# Patient Record
Sex: Male | Born: 1950 | Race: Black or African American | Hispanic: No | Marital: Single | Smoking: Never smoker
Health system: Southern US, Community
[De-identification: ages and names within clinical notes are randomized; demographics above are authoritative.]

## PROBLEM LIST (undated history)

## (undated) DIAGNOSIS — J45909 Unspecified asthma, uncomplicated: Secondary | ICD-10-CM

## (undated) HISTORY — PX: KNEE SURGERY: SHX244

---

## 2013-11-17 ENCOUNTER — Encounter (HOSPITAL_BASED_OUTPATIENT_CLINIC_OR_DEPARTMENT_OTHER): Payer: Self-pay | Admitting: Emergency Medicine

## 2013-11-17 ENCOUNTER — Emergency Department (HOSPITAL_BASED_OUTPATIENT_CLINIC_OR_DEPARTMENT_OTHER)
Admission: EM | Admit: 2013-11-17 | Discharge: 2013-11-17 | Disposition: A | Discharge status: Home / Self Care | Payer: BC Managed Care – PPO | Attending: Emergency Medicine | Admitting: Emergency Medicine

## 2013-11-17 DIAGNOSIS — J069 Acute upper respiratory infection, unspecified: Secondary | ICD-10-CM

## 2013-11-17 DIAGNOSIS — J45909 Unspecified asthma, uncomplicated: Secondary | ICD-10-CM | POA: Insufficient documentation

## 2013-11-17 HISTORY — DX: Unspecified asthma, uncomplicated: J45.909

## 2013-11-17 MED ORDER — PSEUDOEPHEDRINE HCL 60 MG PO TABS: 60.0000 mg | ORAL_TABLET | Freq: Three times a day (TID) | ORAL | Status: AC | PRN

## 2013-11-17 NOTE — ED Notes (Signed)
Pt reports congestion and runny nose since wed- has been using mucinex without relief

## 2013-11-17 NOTE — Discharge Instructions (Signed)
Cool Mist Vaporizers °Vaporizers may help relieve the symptoms of a cough and cold. They add moisture to the air, which helps mucus to become thinner and less sticky. This makes it easier to breathe and cough up secretions. Cool mist vaporizers do not cause serious burns like hot mist vaporizers ("steamers, humidifiers"). Vaporizers have not been proved to show they help with colds. You should not use a vaporizer if you are allergic to mold.  °HOME CARE INSTRUCTIONS °· Follow the package instructions for the vaporizer. °· Do not use anything other than distilled water in the vaporizer. °· Do not run the vaporizer all of the time. This can cause mold or bacteria to grow in the vaporizer. °· Clean the vaporizer after each time it is used. °· Clean and dry the vaporizer well before storing it. °· Stop using the vaporizer if worsening respiratory symptoms develop. °Document Released: 05/06/2004 Document Revised: 04/11/2013 Document Reviewed: 12/27/2012 °ExitCare® Patient Information ©2014 [employer]. ° °

## 2013-11-17 NOTE — ED Provider Notes (Signed)
CSN: [id number]     Arrival date & time 11/17/13  1810 History   First MD Initiated Contact with Patient 11/17/13 1852     Chief Complaint  Patient presents with  . URI     (Consider location/radiation/quality/duration/timing/severity/associated sxs/prior Treatment) Patient is a 63 y.o. male presenting with URI. The history is provided by the patient.  URI Presenting symptoms: congestion and rhinorrhea   Presenting symptoms: no cough, no ear pain, no facial pain, no fever and no sore throat   Severity:  Mild Onset quality:  Gradual Duration:  3 days Timing:  Constant Progression:  Worsening Chronicity:  New Relieved by:  Nothing Worsened by:  Certain positions Ineffective treatments:  OTC medications Associated symptoms: sneezing   Associated symptoms: no headaches, no myalgias and no sinus pain    Donald Hunt is a 63 y.o. male who presents to the ED with nasal congestion that started 3 days ago and has gotten worse. He has been using mucinex without relief. He just wants something to dry up the runny nose.  PMH significant for Asthma as a child but no symptoms in several years.   Past Medical History  Diagnosis Date  . Asthma    Past Surgical History  Procedure Laterality Date  . Knee surgery     No family history on file. History  Substance Use Topics  . Smoking status: Never Smoker   . Smokeless tobacco: Never Used  . Alcohol Use: No    Review of Systems  Constitutional: Negative for fever.  HENT: Positive for congestion, rhinorrhea and sneezing. Negative for ear pain and sore throat.   Respiratory: Negative for cough.   Musculoskeletal: Negative for myalgias.  Neurological: Negative for headaches.      Allergies  Review of patient's allergies indicates no known allergies.  Home Medications  No current outpatient prescriptions on file. BP 160/93  Pulse 62  Temp(Src) 97.9 F (36.6 C) (Oral)  Resp 12  Ht 5\' 9"  (1.753 m)  Wt 190 lb (86.183 kg)  BMI  28.05 kg/m2  SpO2 98% Physical Exam  Nursing note and vitals reviewed. Constitutional: He is oriented to person, place, and time. He appears well-developed and well-nourished.  HENT:  Head: Normocephalic.  Right Ear: Tympanic membrane normal.  Left Ear: Tympanic membrane normal.  Nose: Rhinorrhea present.  Mouth/Throat: Uvula is midline, oropharynx is clear and moist and mucous membranes are normal.  Eyes: EOM are normal.  Neck: Normal range of motion. Neck supple.  Cardiovascular: Normal rate and regular rhythm.   Pulmonary/Chest: Effort normal. He has no wheezes. He has no rales.  Abdominal: Soft. There is no tenderness.  Musculoskeletal: Normal range of motion.  Lymphadenopathy:    He has no cervical adenopathy.  Neurological: He is alert and oriented to person, place, and time. No cranial nerve deficit.  Skin: Skin is warm and dry.  Psychiatric: He has a normal mood and affect. His behavior is normal.    ED Course  Procedures   MDM  63 y.o. male with URI symptoms. Will treat with decongestant and he will follow up with his doctor or return here for worsening symptoms.  Discussed with the patient and all questioned fully answered.    Medication List         pseudoephedrine 60 MG tablet  Commonly known as:  SUDAFED  Take 1 tablet (60 mg total) by mouth every 8 (eight) hours as needed for congestion.         [place]  Janit Bern, NP 11/18/13 6761

## 2013-11-18 NOTE — ED Provider Notes (Signed)
Medical screening examination/treatment/procedure(s) were performed by non-physician practitioner and as supervising physician I was immediately available for consultation/collaboration.     Veryl Speak, MD 11/18/13 2329

## 2014-04-08 ENCOUNTER — Encounter (HOSPITAL_BASED_OUTPATIENT_CLINIC_OR_DEPARTMENT_OTHER): Payer: Self-pay | Admitting: Emergency Medicine

## 2014-04-08 ENCOUNTER — Emergency Department (HOSPITAL_BASED_OUTPATIENT_CLINIC_OR_DEPARTMENT_OTHER): Payer: BC Managed Care – PPO

## 2014-04-08 ENCOUNTER — Emergency Department (HOSPITAL_BASED_OUTPATIENT_CLINIC_OR_DEPARTMENT_OTHER)
Admission: EM | Admit: 2014-04-08 | Discharge: 2014-04-08 | Disposition: A | Discharge status: Home / Self Care | Payer: BC Managed Care – PPO | Attending: Emergency Medicine | Admitting: Emergency Medicine

## 2014-04-08 DIAGNOSIS — S46909A Unspecified injury of unspecified muscle, fascia and tendon at shoulder and upper arm level, unspecified arm, initial encounter: Secondary | ICD-10-CM | POA: Insufficient documentation

## 2014-04-08 DIAGNOSIS — S199XXA Unspecified injury of neck, initial encounter: Secondary | ICD-10-CM

## 2014-04-08 DIAGNOSIS — S4980XA Other specified injuries of shoulder and upper arm, unspecified arm, initial encounter: Secondary | ICD-10-CM | POA: Insufficient documentation

## 2014-04-08 DIAGNOSIS — S0993XA Unspecified injury of face, initial encounter: Secondary | ICD-10-CM | POA: Diagnosis present

## 2014-04-08 DIAGNOSIS — J45909 Unspecified asthma, uncomplicated: Secondary | ICD-10-CM | POA: Diagnosis not present

## 2014-04-08 DIAGNOSIS — Y9389 Activity, other specified: Secondary | ICD-10-CM | POA: Diagnosis not present

## 2014-04-08 DIAGNOSIS — S139XXA Sprain of joints and ligaments of unspecified parts of neck, initial encounter: Secondary | ICD-10-CM | POA: Insufficient documentation

## 2014-04-08 DIAGNOSIS — Y92410 Unspecified street and highway as the place of occurrence of the external cause: Secondary | ICD-10-CM | POA: Insufficient documentation

## 2014-04-08 DIAGNOSIS — S161XXA Strain of muscle, fascia and tendon at neck level, initial encounter: Secondary | ICD-10-CM

## 2014-04-08 NOTE — ED Provider Notes (Signed)
Medical screening examination/treatment/procedure(s) were performed by non-physician practitioner and as supervising physician I was immediately available for consultation/collaboration.   EKG Interpretation None       Orlie Dakin, MD 04/08/14 [phone]

## 2014-04-08 NOTE — Discharge Instructions (Signed)
Cervical Sprain A cervical sprain is when the tissues (ligaments) that hold the neck bones in place stretch or tear. HOME CARE   Put ice on the injured area.  Put ice in a plastic bag.  Place a towel between your skin and the bag.  Leave the ice on for 15-20 minutes, 3-4 times a day.  You may have been given a collar to wear. This collar keeps your neck from moving while you heal.  Do not take the collar off unless told by your doctor.  If you have long hair, keep it outside of the collar.  Ask your doctor before changing the position of your collar. You may need to change its position over time to make it more comfortable.  If you are allowed to take off the collar for cleaning or bathing, follow your doctor's instructions on how to do it safely.  Keep your collar clean by wiping it with mild soap and water. Dry it completely. If the collar has removable pads, remove them every 1-2 days to hand wash them with soap and water. Allow them to air dry. They should be dry before you wear them in the collar.  Do not drive while wearing the collar.  Only take medicine as told by your doctor.  Keep all doctor visits as told.  Keep all physical therapy visits as told.  Adjust your work station so that you have good posture while you work.  Avoid positions and activities that make your problems worse.  Warm up and stretch before being active. GET HELP IF:  Your pain is not controlled with medicine.  You cannot take less pain medicine over time as planned.  Your activity level does not improve as expected. GET HELP RIGHT AWAY IF:   You are bleeding.  Your stomach is upset.  You have an allergic reaction to your medicine.  You develop new problems that you cannot explain.  You lose feeling (become numb) or you cannot move any part of your body (paralysis).  You have tingling or weakness in any part of your body.  Your symptoms get worse. Symptoms include:  Pain,  soreness, stiffness, puffiness (swelling), or a burning feeling in your neck.  Pain when your neck is touched.  Shoulder or upper back pain.  Limited ability to move your neck.  Headache.  Dizziness.  Your hands or arms feel week, lose feeling, or tingle.  Muscle spasms.  Difficulty swallowing or chewing. MAKE SURE YOU:   Understand these instructions.  Will watch your condition.  Will get help right away if you are not doing well or get worse. Document Released: 01/26/2008 Document Revised: 04/11/2013 Document Reviewed: 02/14/2013 [facility] Patient Information 2015 [place], [place]. This information is not intended to replace advice given to you by your health care provider. Make sure you discuss any questions you have with your health care provider.  Cryotherapy Cryotherapy is when you put ice on your injury. Ice helps lessen pain and puffiness (swelling) after an injury. Ice works the best when you start using it in the first 24 to 48 hours after an injury. HOME CARE  Put a dry or damp towel between the ice pack and your skin.  You may press gently on the ice pack.  Leave the ice on for no more than 10 to 20 minutes at a time.  Check your skin after 5 minutes to make sure your skin is okay.  Rest at least 20 minutes between ice pack uses.  Stop using ice when your skin loses feeling (numbness).  Do not use ice on someone who cannot tell you when it hurts. This includes small children and people with memory problems (dementia). GET HELP RIGHT AWAY IF:  You have white spots on your skin.  Your skin turns blue or pale.  Your skin feels waxy or hard.  Your puffiness gets worse. MAKE SURE YOU:   Understand these instructions.  Will watch your condition.  Will get help right away if you are not doing well or get worse. Document Released: 01/26/2008 Document Revised: 11/01/2011 Document Reviewed: 04/01/2011 [facility] Patient Information 2015 [place], [place]. This  information is not intended to replace advice given to you by your health care provider. Make sure you discuss any questions you have with your health care provider.
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
## 2018-06-11 ENCOUNTER — Encounter (HOSPITAL_BASED_OUTPATIENT_CLINIC_OR_DEPARTMENT_OTHER): Payer: Self-pay | Admitting: *Deleted

## 2018-06-11 ENCOUNTER — Other Ambulatory Visit: Payer: Self-pay

## 2018-06-11 ENCOUNTER — Emergency Department (HOSPITAL_BASED_OUTPATIENT_CLINIC_OR_DEPARTMENT_OTHER): Payer: Self-pay

## 2018-06-11 ENCOUNTER — Emergency Department (HOSPITAL_BASED_OUTPATIENT_CLINIC_OR_DEPARTMENT_OTHER)
Admission: EM | Admit: 2018-06-11 | Discharge: 2018-06-11 | Disposition: A | Discharge status: Home / Self Care | Payer: Self-pay | Attending: Emergency Medicine | Admitting: Emergency Medicine

## 2018-06-11 DIAGNOSIS — J45909 Unspecified asthma, uncomplicated: Secondary | ICD-10-CM | POA: Insufficient documentation

## 2018-06-11 DIAGNOSIS — R2242 Localized swelling, mass and lump, left lower limb: Secondary | ICD-10-CM | POA: Insufficient documentation

## 2018-06-11 DIAGNOSIS — M7652 Patellar tendinitis, left knee: Secondary | ICD-10-CM | POA: Insufficient documentation

## 2018-06-11 MED ORDER — OXYCODONE-ACETAMINOPHEN 5-325 MG PO TABS
1.0000 | ORAL_TABLET | Freq: Once | ORAL | Status: AC
  Administered 2018-06-11: 1 via ORAL
  Filled 2018-06-11: qty 1

## 2018-06-11 NOTE — ED Triage Notes (Signed)
Left knee pain since yesterday. No known injury.  Ambulatory.

## 2018-06-11 NOTE — ED Notes (Signed)
Patient transported to X-ray 

## 2018-06-11 NOTE — ED Notes (Signed)
Pt understood dc material. NAD noted. 

## 2018-06-11 NOTE — ED Provider Notes (Signed)
[facility] EMERGENCY DEPARTMENT Provider Note   CSN: [id number] Arrival date & time: 06/11/18  1710     History   Chief Complaint Chief Complaint  Patient presents with  . Knee Pain    HPI Donald Hunt is a 67 y.o. male here for evaluation of left knee pain.  Onset suddenly, he yesterday.  Pain is all over the anterior knee.  Pain is worse with any palpation, bending, weightbearing.  Applied a heating pad and over-the-counter arthritis medication without relief.  Associated with swelling and mild warmth.  He denies any recent falls or trauma.  No known previous injuries or surgeries.  No recent injections or procedures in the joint.  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  Pain is slightly improved when he is stretching his leg out or resting.  Denies associated fevers, chills, redness to the skin, calf pain or swelling, distal paresthesias or numbness.  No history of gout.  HPI  Past Medical History:  Diagnosis Date  . Asthma     There are no active problems to display for this patient.   Past Surgical History:  Procedure Laterality Date  . KNEE SURGERY          Home Medications    Prior to Admission medications   Medication Sig Start Date End Date Taking? Authorizing Provider  cyclobenzaprine (FLEXERIL) 5 MG tablet Take 5 mg by mouth 3 (three) times daily as needed for muscle spasms.    [provider]  HYDROcodone-acetaminophen (NORCO/VICODIN) 5-325 MG per tablet Take 1 tablet by mouth every 6 (six) hours as needed for moderate pain.    [provider]  pseudoephedrine (SUDAFED) 60 MG tablet Take 1 tablet (60 mg total) by mouth every 8 (eight) hours as needed for congestion. 11/17/13   Ashley Murrain, NP    Family History History reviewed. No pertinent family history.  Social History Social History   Tobacco Use  . Smoking status: Never Smoker  . Smokeless tobacco: Never Used    Substance Use Topics  . Alcohol use: No  . Drug use: No     Allergies   Patient has no known allergies.   Review of Systems Review of Systems  Musculoskeletal: Positive for arthralgias, gait problem and joint swelling.  All other systems reviewed and are negative.    Physical Exam Updated Vital Signs BP (!) 162/87 (BP Location: Right Arm)   Pulse (!) 52   Temp 98.2 F (36.8 C) (Oral)   Resp 16   Ht 5\' 9"  (1.753 m)   Wt 81.6 kg   SpO2 100%   BMI 26.58 kg/m   Physical Exam  Constitutional: He is oriented to person, place, and time. He appears well-developed and well-nourished.  Non-toxic appearance.  HENT:  Head: Normocephalic.  Right Ear: External ear normal.  Left Ear: External ear normal.  Nose: Nose normal.  Eyes: Conjunctivae and EOM are normal.  Neck: Full passive range of motion without pain.  Cardiovascular: Normal rate.  Pulmonary/Chest: Effort normal. No tachypnea. No respiratory distress.  Musculoskeletal: Normal range of motion. He exhibits edema and tenderness.  Left knee: mild medial/infrapatellar edema. Tenderness to medial joint line, patella, patellar tendon and quad tendon.  Full passive ROM of knee with normal J tracking of patella with moderate pain worse with deep flexion.  Positive medial McMurray's test.  Pain with valgus force. No overlaying skin erythema, warmth, abrasions.  No lateral joint line,  MCL, LCL, popliteal space tenderness.  No bony tenderness over fibular head or tibial tuberosity.  Negative Lachman's. Negative posterior drawer test.    Neurological: He is alert and oriented to person, place, and time.  Skin: Skin is warm and dry. Capillary refill takes less than 2 seconds.  Psychiatric: His behavior is normal. Thought content normal.     ED Treatments / Results  Labs (all labs ordered are listed, but only abnormal results are displayed) Labs Reviewed - No data to display  EKG None  Radiology Dg Knee Complete 4 Views  Left  Result Date: 06/11/2018 CLINICAL DATA:  Left knee pain and swelling for 2 days, atraumatic EXAM: LEFT KNEE - COMPLETE 4+ VIEW COMPARISON:  None. FINDINGS: No evidence of fracture, dislocation, or joint effusion. Mild degenerative marginal spurring. Prominent enthesophyte formation along the flexor mechanism. IMPRESSION: 1. No acute finding. 2. Mild degenerative marginal spurring. Electronically Signed   By: Monte Fantasia M.D.   On: 06/11/2018 19:21    Procedures Procedures (including critical care time)  Medications Ordered in ED Medications  oxyCODONE-acetaminophen (PERCOCET/ROXICET) 5-325 MG per tablet 1 tablet (1 tablet Oral Given 06/11/18 1835)     Initial Impression / Assessment and Plan / ED Course  I have reviewed the triage vital signs and the nursing notes.  Pertinent labs & imaging results that were available during my care of the patient were reviewed by me and considered in my medical decision making (see chart for details).      67 year old here for atraumatic left knee pain.  He has focal tenderness to the patellar tendon and medial joint line.  No overlying signs of cellulitis.  Considered gout however he has no history of this and clinical exam is not convincing of this either.  No distal calf edema or tenderness and DVT is unlikely.  He is not on any anticoagulants and has not had any trauma to it so hemarthrosis is unlikely.  X-ray today shows degenerative spurring as well as bony formation in the patellar tendon.  This fits clinical picture.  Extremity is neurovascularly intact.  He still has good strength with knee flexion and extension.  I showed patient his x-rays and explained pain is likely from bony formation in the patellar tendon causing tendinitis.  Will discharge with symptomatic management.  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  Recommended rest, rice, high-dose NSAIDs,  follow-up with orthopedist in 7 to 10 days if the symptoms are not improving.  Return precautions were discussed.  1953: review of x-ray show bony formation in patellar tendon.  Spoke to radiologist (Dr. Pascal Lux) who agrees, will make addendum to impression.  Final Clinical Impressions(s) / ED Diagnoses   Final diagnoses:  Patellar tendinitis of left knee    ED Discharge Orders    None       Arlean Hopping 06/11/18 1955    Hayden Rasmussen, MD 06/11/18 2318

## 2018-06-11 NOTE — Discharge Instructions (Signed)
You were seen in the ER for left knee pain.   X-ray show bony growths/spurring along your patellar tendon which attaches your knee cap to your shin bone.  This is probably causing your pain and inflammation of patellar tendon.   Initial treatment includes rest, ice, ibuprofen and acetaminophen, compression.  Take 600 mg ibuprofen every 6 hours. For more pain control you can add [phone] mg acetaminophen every 6 hours.    If symptoms do not improve, follow up with orthopedist.  Return to ER for increased swelling or pain, redness, warmth, sudden pop sensation in the knee or inability to extend your knee, fevers, chills, calf pain or swelling.

## 2018-06-26 ENCOUNTER — Ambulatory Visit: Payer: Self-pay | Admitting: Family Medicine

## 2018-06-29 ENCOUNTER — Ambulatory Visit: Payer: Self-pay | Admitting: Family Medicine

## 2018-08-14 DIAGNOSIS — H2513 Age-related nuclear cataract, bilateral: Secondary | ICD-10-CM | POA: Diagnosis not present

## 2018-08-14 DIAGNOSIS — H524 Presbyopia: Secondary | ICD-10-CM | POA: Diagnosis not present

## 2018-09-18 DIAGNOSIS — H40013 Open angle with borderline findings, low risk, bilateral: Secondary | ICD-10-CM | POA: Diagnosis not present

## 2018-09-19 DIAGNOSIS — M62838 Other muscle spasm: Secondary | ICD-10-CM | POA: Diagnosis not present

## 2018-09-19 DIAGNOSIS — M792 Neuralgia and neuritis, unspecified: Secondary | ICD-10-CM | POA: Diagnosis not present

## 2018-09-19 DIAGNOSIS — Z79899 Other long term (current) drug therapy: Secondary | ICD-10-CM | POA: Diagnosis not present

## 2018-09-19 DIAGNOSIS — M1990 Unspecified osteoarthritis, unspecified site: Secondary | ICD-10-CM | POA: Diagnosis not present

## 2018-09-19 DIAGNOSIS — M129 Arthropathy, unspecified: Secondary | ICD-10-CM | POA: Diagnosis not present

## 2018-09-28 DIAGNOSIS — M792 Neuralgia and neuritis, unspecified: Secondary | ICD-10-CM | POA: Diagnosis not present

## 2018-09-28 DIAGNOSIS — R7301 Impaired fasting glucose: Secondary | ICD-10-CM | POA: Diagnosis not present

## 2018-09-28 DIAGNOSIS — R5383 Other fatigue: Secondary | ICD-10-CM | POA: Diagnosis not present

## 2018-09-28 DIAGNOSIS — M62838 Other muscle spasm: Secondary | ICD-10-CM | POA: Diagnosis not present

## 2018-09-28 DIAGNOSIS — E7800 Pure hypercholesterolemia, unspecified: Secondary | ICD-10-CM | POA: Diagnosis not present

## 2018-09-28 DIAGNOSIS — M1990 Unspecified osteoarthritis, unspecified site: Secondary | ICD-10-CM | POA: Diagnosis not present

## 2018-09-28 DIAGNOSIS — E559 Vitamin D deficiency, unspecified: Secondary | ICD-10-CM | POA: Diagnosis not present

## 2018-09-28 DIAGNOSIS — K219 Gastro-esophageal reflux disease without esophagitis: Secondary | ICD-10-CM | POA: Diagnosis not present

## 2018-09-28 DIAGNOSIS — Z0000 Encounter for general adult medical examination without abnormal findings: Secondary | ICD-10-CM | POA: Diagnosis not present

## 2018-09-28 DIAGNOSIS — M25561 Pain in right knee: Secondary | ICD-10-CM | POA: Diagnosis not present

## 2018-10-12 DIAGNOSIS — M25562 Pain in left knee: Secondary | ICD-10-CM | POA: Diagnosis not present

## 2018-10-12 DIAGNOSIS — M792 Neuralgia and neuritis, unspecified: Secondary | ICD-10-CM | POA: Diagnosis not present

## 2018-10-12 DIAGNOSIS — Z79899 Other long term (current) drug therapy: Secondary | ICD-10-CM | POA: Diagnosis not present

## 2018-10-12 DIAGNOSIS — M25561 Pain in right knee: Secondary | ICD-10-CM | POA: Diagnosis not present

## 2018-10-12 DIAGNOSIS — G8929 Other chronic pain: Secondary | ICD-10-CM | POA: Diagnosis not present

## 2018-11-09 DIAGNOSIS — E7800 Pure hypercholesterolemia, unspecified: Secondary | ICD-10-CM | POA: Diagnosis not present

## 2018-11-09 DIAGNOSIS — M792 Neuralgia and neuritis, unspecified: Secondary | ICD-10-CM | POA: Diagnosis not present

## 2018-11-09 DIAGNOSIS — Z0000 Encounter for general adult medical examination without abnormal findings: Secondary | ICD-10-CM | POA: Diagnosis not present

## 2018-11-09 DIAGNOSIS — Z79899 Other long term (current) drug therapy: Secondary | ICD-10-CM | POA: Diagnosis not present

## 2018-11-09 DIAGNOSIS — G8929 Other chronic pain: Secondary | ICD-10-CM | POA: Diagnosis not present

## 2018-11-09 DIAGNOSIS — E1165 Type 2 diabetes mellitus with hyperglycemia: Secondary | ICD-10-CM | POA: Diagnosis not present

## 2018-12-05 DIAGNOSIS — M62838 Other muscle spasm: Secondary | ICD-10-CM | POA: Diagnosis not present

## 2018-12-05 DIAGNOSIS — E1165 Type 2 diabetes mellitus with hyperglycemia: Secondary | ICD-10-CM | POA: Diagnosis not present

## 2018-12-05 DIAGNOSIS — M25562 Pain in left knee: Secondary | ICD-10-CM | POA: Diagnosis not present

## 2018-12-05 DIAGNOSIS — Z79899 Other long term (current) drug therapy: Secondary | ICD-10-CM | POA: Diagnosis not present

## 2018-12-05 DIAGNOSIS — M792 Neuralgia and neuritis, unspecified: Secondary | ICD-10-CM | POA: Diagnosis not present

## 2018-12-05 DIAGNOSIS — M25561 Pain in right knee: Secondary | ICD-10-CM | POA: Diagnosis not present

## 2018-12-15 DIAGNOSIS — M109 Gout, unspecified: Secondary | ICD-10-CM | POA: Diagnosis not present

## 2018-12-15 DIAGNOSIS — E7800 Pure hypercholesterolemia, unspecified: Secondary | ICD-10-CM | POA: Diagnosis not present

## 2019-01-11 DIAGNOSIS — G8929 Other chronic pain: Secondary | ICD-10-CM | POA: Diagnosis not present

## 2019-01-11 DIAGNOSIS — M25562 Pain in left knee: Secondary | ICD-10-CM | POA: Diagnosis not present

## 2019-01-11 DIAGNOSIS — M62838 Other muscle spasm: Secondary | ICD-10-CM | POA: Diagnosis not present

## 2019-01-11 DIAGNOSIS — Z79899 Other long term (current) drug therapy: Secondary | ICD-10-CM | POA: Diagnosis not present

## 2019-01-11 DIAGNOSIS — M25561 Pain in right knee: Secondary | ICD-10-CM | POA: Diagnosis not present

## 2019-02-09 DIAGNOSIS — M25561 Pain in right knee: Secondary | ICD-10-CM | POA: Diagnosis not present

## 2019-02-09 DIAGNOSIS — K219 Gastro-esophageal reflux disease without esophagitis: Secondary | ICD-10-CM | POA: Diagnosis not present

## 2019-02-09 DIAGNOSIS — E1165 Type 2 diabetes mellitus with hyperglycemia: Secondary | ICD-10-CM | POA: Diagnosis not present

## 2019-02-09 DIAGNOSIS — G8929 Other chronic pain: Secondary | ICD-10-CM | POA: Diagnosis not present

## 2019-02-09 DIAGNOSIS — M25562 Pain in left knee: Secondary | ICD-10-CM | POA: Diagnosis not present

## 2019-02-09 DIAGNOSIS — Z79899 Other long term (current) drug therapy: Secondary | ICD-10-CM | POA: Diagnosis not present

## 2019-03-09 DIAGNOSIS — Z79899 Other long term (current) drug therapy: Secondary | ICD-10-CM | POA: Diagnosis not present

## 2019-03-09 DIAGNOSIS — M109 Gout, unspecified: Secondary | ICD-10-CM | POA: Diagnosis not present

## 2019-03-09 DIAGNOSIS — E559 Vitamin D deficiency, unspecified: Secondary | ICD-10-CM | POA: Diagnosis not present

## 2019-03-09 DIAGNOSIS — E7800 Pure hypercholesterolemia, unspecified: Secondary | ICD-10-CM | POA: Diagnosis not present

## 2019-03-09 DIAGNOSIS — M62838 Other muscle spasm: Secondary | ICD-10-CM | POA: Diagnosis not present

## 2019-03-09 DIAGNOSIS — E1165 Type 2 diabetes mellitus with hyperglycemia: Secondary | ICD-10-CM | POA: Diagnosis not present

## 2019-03-09 DIAGNOSIS — Z1159 Encounter for screening for other viral diseases: Secondary | ICD-10-CM | POA: Diagnosis not present

## 2019-03-16 DIAGNOSIS — M25632 Stiffness of left wrist, not elsewhere classified: Secondary | ICD-10-CM | POA: Diagnosis not present

## 2019-03-16 DIAGNOSIS — M62532 Muscle wasting and atrophy, not elsewhere classified, left forearm: Secondary | ICD-10-CM | POA: Diagnosis not present

## 2019-03-16 DIAGNOSIS — M79642 Pain in left hand: Secondary | ICD-10-CM | POA: Diagnosis not present

## 2019-03-16 DIAGNOSIS — M25532 Pain in left wrist: Secondary | ICD-10-CM | POA: Diagnosis not present

## 2019-03-16 DIAGNOSIS — M62542 Muscle wasting and atrophy, not elsewhere classified, left hand: Secondary | ICD-10-CM | POA: Diagnosis not present

## 2019-03-20 DIAGNOSIS — M25532 Pain in left wrist: Secondary | ICD-10-CM | POA: Diagnosis not present

## 2019-03-20 DIAGNOSIS — M79642 Pain in left hand: Secondary | ICD-10-CM | POA: Diagnosis not present

## 2019-03-20 DIAGNOSIS — M62542 Muscle wasting and atrophy, not elsewhere classified, left hand: Secondary | ICD-10-CM | POA: Diagnosis not present

## 2019-03-20 DIAGNOSIS — M62532 Muscle wasting and atrophy, not elsewhere classified, left forearm: Secondary | ICD-10-CM | POA: Diagnosis not present

## 2019-03-20 DIAGNOSIS — M25632 Stiffness of left wrist, not elsewhere classified: Secondary | ICD-10-CM | POA: Diagnosis not present

## 2019-03-22 DIAGNOSIS — M25521 Pain in right elbow: Secondary | ICD-10-CM | POA: Diagnosis not present

## 2019-03-22 DIAGNOSIS — M109 Gout, unspecified: Secondary | ICD-10-CM | POA: Diagnosis not present

## 2019-03-27 DIAGNOSIS — M25632 Stiffness of left wrist, not elsewhere classified: Secondary | ICD-10-CM | POA: Diagnosis not present

## 2019-03-27 DIAGNOSIS — M25532 Pain in left wrist: Secondary | ICD-10-CM | POA: Diagnosis not present

## 2019-03-27 DIAGNOSIS — M62542 Muscle wasting and atrophy, not elsewhere classified, left hand: Secondary | ICD-10-CM | POA: Diagnosis not present

## 2019-03-27 DIAGNOSIS — M62532 Muscle wasting and atrophy, not elsewhere classified, left forearm: Secondary | ICD-10-CM | POA: Diagnosis not present

## 2019-03-27 DIAGNOSIS — M79642 Pain in left hand: Secondary | ICD-10-CM | POA: Diagnosis not present

## 2019-03-29 DIAGNOSIS — M62542 Muscle wasting and atrophy, not elsewhere classified, left hand: Secondary | ICD-10-CM | POA: Diagnosis not present

## 2019-03-29 DIAGNOSIS — M25532 Pain in left wrist: Secondary | ICD-10-CM | POA: Diagnosis not present

## 2019-03-29 DIAGNOSIS — M79642 Pain in left hand: Secondary | ICD-10-CM | POA: Diagnosis not present

## 2019-03-29 DIAGNOSIS — M62532 Muscle wasting and atrophy, not elsewhere classified, left forearm: Secondary | ICD-10-CM | POA: Diagnosis not present

## 2019-03-29 DIAGNOSIS — M25632 Stiffness of left wrist, not elsewhere classified: Secondary | ICD-10-CM | POA: Diagnosis not present

## 2019-04-03 DIAGNOSIS — M62542 Muscle wasting and atrophy, not elsewhere classified, left hand: Secondary | ICD-10-CM | POA: Diagnosis not present

## 2019-04-03 DIAGNOSIS — M25532 Pain in left wrist: Secondary | ICD-10-CM | POA: Diagnosis not present

## 2019-04-03 DIAGNOSIS — M79642 Pain in left hand: Secondary | ICD-10-CM | POA: Diagnosis not present

## 2019-04-03 DIAGNOSIS — M62532 Muscle wasting and atrophy, not elsewhere classified, left forearm: Secondary | ICD-10-CM | POA: Diagnosis not present

## 2019-04-03 DIAGNOSIS — M25632 Stiffness of left wrist, not elsewhere classified: Secondary | ICD-10-CM | POA: Diagnosis not present

## 2019-04-06 DIAGNOSIS — Z79899 Other long term (current) drug therapy: Secondary | ICD-10-CM | POA: Diagnosis not present

## 2019-04-06 DIAGNOSIS — Z1159 Encounter for screening for other viral diseases: Secondary | ICD-10-CM | POA: Diagnosis not present

## 2019-04-06 DIAGNOSIS — M79642 Pain in left hand: Secondary | ICD-10-CM | POA: Diagnosis not present

## 2019-04-06 DIAGNOSIS — M25532 Pain in left wrist: Secondary | ICD-10-CM | POA: Diagnosis not present

## 2019-04-06 DIAGNOSIS — M62542 Muscle wasting and atrophy, not elsewhere classified, left hand: Secondary | ICD-10-CM | POA: Diagnosis not present

## 2019-04-06 DIAGNOSIS — E1165 Type 2 diabetes mellitus with hyperglycemia: Secondary | ICD-10-CM | POA: Diagnosis not present

## 2019-04-06 DIAGNOSIS — M109 Gout, unspecified: Secondary | ICD-10-CM | POA: Diagnosis not present

## 2019-04-06 DIAGNOSIS — M25561 Pain in right knee: Secondary | ICD-10-CM | POA: Diagnosis not present

## 2019-04-06 DIAGNOSIS — M25562 Pain in left knee: Secondary | ICD-10-CM | POA: Diagnosis not present

## 2019-04-06 DIAGNOSIS — M62532 Muscle wasting and atrophy, not elsewhere classified, left forearm: Secondary | ICD-10-CM | POA: Diagnosis not present

## 2019-04-06 DIAGNOSIS — G8929 Other chronic pain: Secondary | ICD-10-CM | POA: Diagnosis not present

## 2019-04-06 DIAGNOSIS — M25632 Stiffness of left wrist, not elsewhere classified: Secondary | ICD-10-CM | POA: Diagnosis not present

## 2019-04-09 DIAGNOSIS — M62532 Muscle wasting and atrophy, not elsewhere classified, left forearm: Secondary | ICD-10-CM | POA: Diagnosis not present

## 2019-04-09 DIAGNOSIS — M79642 Pain in left hand: Secondary | ICD-10-CM | POA: Diagnosis not present

## 2019-04-09 DIAGNOSIS — M25532 Pain in left wrist: Secondary | ICD-10-CM | POA: Diagnosis not present

## 2019-04-09 DIAGNOSIS — M25632 Stiffness of left wrist, not elsewhere classified: Secondary | ICD-10-CM | POA: Diagnosis not present

## 2019-04-09 DIAGNOSIS — M62542 Muscle wasting and atrophy, not elsewhere classified, left hand: Secondary | ICD-10-CM | POA: Diagnosis not present

## 2019-04-12 DIAGNOSIS — S5402XS Injury of ulnar nerve at forearm level, left arm, sequela: Secondary | ICD-10-CM | POA: Diagnosis not present

## 2019-04-12 DIAGNOSIS — Z9889 Other specified postprocedural states: Secondary | ICD-10-CM | POA: Diagnosis not present

## 2019-04-12 DIAGNOSIS — M25532 Pain in left wrist: Secondary | ICD-10-CM | POA: Diagnosis not present

## 2019-04-12 DIAGNOSIS — G5622 Lesion of ulnar nerve, left upper limb: Secondary | ICD-10-CM | POA: Diagnosis not present

## 2019-04-18 DIAGNOSIS — Z9189 Other specified personal risk factors, not elsewhere classified: Secondary | ICD-10-CM | POA: Diagnosis not present

## 2019-04-18 DIAGNOSIS — U071 COVID-19: Secondary | ICD-10-CM | POA: Diagnosis not present

## 2019-04-25 ENCOUNTER — Encounter: Payer: Self-pay | Admitting: Neurology

## 2019-04-25 ENCOUNTER — Other Ambulatory Visit: Payer: Self-pay

## 2019-04-25 DIAGNOSIS — S5402XA Injury of ulnar nerve at forearm level, left arm, initial encounter: Secondary | ICD-10-CM | POA: Diagnosis not present

## 2019-04-25 DIAGNOSIS — G5603 Carpal tunnel syndrome, bilateral upper limbs: Secondary | ICD-10-CM

## 2019-04-25 DIAGNOSIS — M25532 Pain in left wrist: Secondary | ICD-10-CM | POA: Diagnosis not present

## 2019-05-02 DIAGNOSIS — K219 Gastro-esophageal reflux disease without esophagitis: Secondary | ICD-10-CM | POA: Diagnosis not present

## 2019-05-02 DIAGNOSIS — Z1211 Encounter for screening for malignant neoplasm of colon: Secondary | ICD-10-CM | POA: Diagnosis not present

## 2019-05-07 DIAGNOSIS — M792 Neuralgia and neuritis, unspecified: Secondary | ICD-10-CM | POA: Diagnosis not present

## 2019-05-07 DIAGNOSIS — M25562 Pain in left knee: Secondary | ICD-10-CM | POA: Diagnosis not present

## 2019-05-07 DIAGNOSIS — E1165 Type 2 diabetes mellitus with hyperglycemia: Secondary | ICD-10-CM | POA: Diagnosis not present

## 2019-05-07 DIAGNOSIS — Z1159 Encounter for screening for other viral diseases: Secondary | ICD-10-CM | POA: Diagnosis not present

## 2019-05-07 DIAGNOSIS — Z79899 Other long term (current) drug therapy: Secondary | ICD-10-CM | POA: Diagnosis not present

## 2019-05-07 DIAGNOSIS — M25561 Pain in right knee: Secondary | ICD-10-CM | POA: Diagnosis not present

## 2019-05-31 ENCOUNTER — Encounter: Payer: Self-pay | Admitting: Neurology

## 2019-06-13 DIAGNOSIS — Z1159 Encounter for screening for other viral diseases: Secondary | ICD-10-CM | POA: Diagnosis not present

## 2019-06-13 DIAGNOSIS — M25562 Pain in left knee: Secondary | ICD-10-CM | POA: Diagnosis not present

## 2019-06-13 DIAGNOSIS — M25561 Pain in right knee: Secondary | ICD-10-CM | POA: Diagnosis not present

## 2019-06-13 DIAGNOSIS — Z23 Encounter for immunization: Secondary | ICD-10-CM | POA: Diagnosis not present

## 2019-06-13 DIAGNOSIS — Z79899 Other long term (current) drug therapy: Secondary | ICD-10-CM | POA: Diagnosis not present

## 2019-06-15 DIAGNOSIS — E119 Type 2 diabetes mellitus without complications: Secondary | ICD-10-CM | POA: Diagnosis not present

## 2019-06-15 DIAGNOSIS — K2100 Gastro-esophageal reflux disease with esophagitis, without bleeding: Secondary | ICD-10-CM | POA: Diagnosis not present

## 2019-06-15 DIAGNOSIS — A048 Other specified bacterial intestinal infections: Secondary | ICD-10-CM | POA: Diagnosis not present

## 2019-06-15 DIAGNOSIS — K2970 Gastritis, unspecified, without bleeding: Secondary | ICD-10-CM | POA: Diagnosis not present

## 2019-06-15 DIAGNOSIS — K5281 Eosinophilic gastritis or gastroenteritis: Secondary | ICD-10-CM | POA: Diagnosis not present

## 2019-06-15 DIAGNOSIS — Z01818 Encounter for other preprocedural examination: Secondary | ICD-10-CM | POA: Diagnosis not present

## 2019-06-15 DIAGNOSIS — K2270 Barrett's esophagus without dysplasia: Secondary | ICD-10-CM | POA: Diagnosis not present

## 2019-06-15 DIAGNOSIS — K219 Gastro-esophageal reflux disease without esophagitis: Secondary | ICD-10-CM | POA: Diagnosis not present

## 2019-06-29 DIAGNOSIS — B9681 Helicobacter pylori [H. pylori] as the cause of diseases classified elsewhere: Secondary | ICD-10-CM | POA: Diagnosis not present

## 2019-06-29 DIAGNOSIS — K2950 Unspecified chronic gastritis without bleeding: Secondary | ICD-10-CM | POA: Diagnosis not present

## 2019-06-29 DIAGNOSIS — K2970 Gastritis, unspecified, without bleeding: Secondary | ICD-10-CM | POA: Diagnosis not present

## 2019-07-05 ENCOUNTER — Ambulatory Visit (INDEPENDENT_AMBULATORY_CARE_PROVIDER_SITE_OTHER): Payer: Medicare Other | Admitting: Neurology

## 2019-07-05 ENCOUNTER — Other Ambulatory Visit: Payer: Self-pay

## 2019-07-05 DIAGNOSIS — G5603 Carpal tunnel syndrome, bilateral upper limbs: Secondary | ICD-10-CM | POA: Diagnosis not present

## 2019-07-05 DIAGNOSIS — G5623 Lesion of ulnar nerve, bilateral upper limbs: Secondary | ICD-10-CM

## 2019-07-05 NOTE — Procedures (Signed)
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  NCV & EMG Findings: Extensive electrodiagnostic testing of the left upper extremity and additional studies of the right shows:  1. Bilateral median and left ulnar sensory responses show prolonged latency (L3.9, R4.5, L3.6 ms).  Bilateral dorsal ulnar cutaneous and right ulnar sensory responses are within normal limits. 2. Bilateral median motor responses are within normal limits.  Bilateral ulnar motor responses show slowed conduction velocity across the elbow (A Elbow-B Elbow, L43, R42 m/s) without conduction block.   3. In the left upper extremity, chronic motor axonal loss changes are seen affecting the first dorsal interosseous, abductor digiti minimi, and flexor carpi ulnaris muscles, without accompanied active denervation.  These findings are not present in the right upper extremity.  Impression: 1. Left ulnar neuropathy with slowing across the elbow, purely demyelinating, moderate. 2. Right ulnar neuropathy with slowing across the elbow, purely demyelinating, mild. 3. Bilateral median neuropathy at or distal to the wrist (mild), consistent with a clinical diagnosis of carpal tunnel syndrome.     ___________________________ Narda Amber, DO    Nerve Conduction Studies Anti Sensory Summary Table   Site NR Peak (ms) Norm Peak (ms) P-T Amp (V) Norm P-T Amp  Left DorsCutan Anti Sensory (Dorsum 5th MC)  35C  Wrist    2.0 <3.2 9.2 >5  Right DorsCutan Anti Sensory (Dorsum  5th MC)  35C  Wrist    2.2 <3.2 8.4 >5  Left Median Anti Sensory (2nd Digit)  35C  Wrist    3.9 <3.8 11.4 >10  Right Median Anti Sensory (2nd Digit)  35C  Wrist    4.5 <3.8 10.1 >10  Left Ulnar Anti Sensory (5th Digit)  35C  Wrist    3.6 <3.2 7.1 >5  Right Ulnar Anti Sensory (5th Digit)  35C  Wrist    3.2 <3.2 9.2 >5   Motor Summary Table   Site NR Onset (ms) Norm Onset (ms) O-P Amp (mV) Norm O-P Amp Site1 Site2 Delta-0 (ms) Dist (cm) Vel (m/s) Norm Vel (m/s)  Left Median Motor (Abd Poll Brev)  35C  Wrist    3.9 <4.0 11.9 >5 Elbow Wrist 6.1 32.0 52 >50  Elbow    10.0  10.8         Right Median Motor (Abd Poll Brev)  35C  Wrist    3.9 <4.0 13.5 >5 Elbow Wrist 5.9 30.0 51 >50  Elbow    9.8  12.4         Left Ulnar Motor (Abd Dig Minimi)  35C  Wrist    2.7 <3.1 11.6 >7 B Elbow Wrist 4.6 25.0 54 >50  B Elbow    7.3  10.1  A Elbow B Elbow 2.3 10.0 43 >50  A Elbow    9.6  9.4         Right Ulnar Motor (Abd Dig Minimi)  35C  Wrist    2.6 <3.1 11.4 >7 B Elbow Wrist 4.6 24.0 52 >50  B Elbow    7.2  10.2  A Elbow B Elbow 2.4 10.0 42 >50  A Elbow    9.6  9.9          EMG   Side Muscle Ins Act Fibs Psw Fasc Number Recrt Dur Dur. Amp Amp. Poly Poly. Comment  Left FlexCarpiUln Nml Nml Nml Nml 1- Rapid Few 1+ Few 1+ Nml Nml N/A  Left 1stDorInt Nml Nml Nml Nml 2- Rapid Many 1+ Many 1+ Many 1+ N/A  Left ABD Dig Min Nml Nml Nml Nml 2- Rapid Most 1+ Most 1+ Most 1+ N/A  Left Abd Poll Brev Nml Nml Nml Nml Nml Nml Nml Nml Nml Nml Nml Nml N/A  Left Ext Indicis Nml Nml Nml Nml Nml Nml Nml Nml Nml Nml Nml Nml N/A  Left PronatorTeres Nml Nml Nml Nml Nml Nml Nml Nml Nml Nml Nml Nml N/A  Left Triceps Nml Nml Nml Nml Nml Nml Nml Nml Nml Nml Nml Nml N/A  Left Deltoid Nml Nml Nml Nml Nml Nml Nml Nml Nml Nml Nml Nml N/A  Left Biceps Nml Nml Nml Nml Nml Nml Nml Nml Nml Nml Nml Nml N/A  Right 1stDorInt Nml Nml Nml Nml Nml Nml Nml Nml Nml Nml Nml Nml N/A  Right Abd Poll Brev Nml Nml Nml Nml Nml Nml  Nml Nml Nml Nml Nml Nml N/A  Right PronatorTeres Nml Nml Nml Nml Nml Nml Nml Nml Nml Nml Nml Nml N/A  Right Biceps Nml Nml Nml Nml Nml Nml Nml Nml Nml Nml Nml Nml N/A  Right Triceps Nml Nml Nml Nml Nml Nml Nml Nml Nml Nml Nml Nml N/A  Right Deltoid Nml Nml Nml Nml Nml Nml Nml Nml Nml Nml Nml Nml N/A  Right ABD Dig Min Nml Nml Nml Nml Nml Nml Nml Nml Nml Nml Nml Nml N/A  Right FlexCarpiUln Nml Nml Nml Nml Nml Nml Nml Nml Nml Nml Nml Nml N/A      Waveforms:

## 2019-07-06 DIAGNOSIS — E7800 Pure hypercholesterolemia, unspecified: Secondary | ICD-10-CM | POA: Diagnosis not present

## 2019-07-06 DIAGNOSIS — M109 Gout, unspecified: Secondary | ICD-10-CM | POA: Diagnosis not present

## 2019-07-06 DIAGNOSIS — M25562 Pain in left knee: Secondary | ICD-10-CM | POA: Diagnosis not present

## 2019-07-06 DIAGNOSIS — M25561 Pain in right knee: Secondary | ICD-10-CM | POA: Diagnosis not present

## 2019-07-06 DIAGNOSIS — E559 Vitamin D deficiency, unspecified: Secondary | ICD-10-CM | POA: Diagnosis not present

## 2019-07-06 DIAGNOSIS — Z79899 Other long term (current) drug therapy: Secondary | ICD-10-CM | POA: Diagnosis not present

## 2019-07-06 DIAGNOSIS — G8929 Other chronic pain: Secondary | ICD-10-CM | POA: Diagnosis not present

## 2019-07-06 DIAGNOSIS — E1165 Type 2 diabetes mellitus with hyperglycemia: Secondary | ICD-10-CM | POA: Diagnosis not present

## 2019-07-06 DIAGNOSIS — Z1159 Encounter for screening for other viral diseases: Secondary | ICD-10-CM | POA: Diagnosis not present

## 2019-07-13 DIAGNOSIS — Z1211 Encounter for screening for malignant neoplasm of colon: Secondary | ICD-10-CM | POA: Diagnosis not present

## 2019-07-13 DIAGNOSIS — A048 Other specified bacterial intestinal infections: Secondary | ICD-10-CM | POA: Diagnosis not present

## 2019-07-13 DIAGNOSIS — Z01818 Encounter for other preprocedural examination: Secondary | ICD-10-CM | POA: Diagnosis not present

## 2019-07-13 DIAGNOSIS — K635 Polyp of colon: Secondary | ICD-10-CM | POA: Diagnosis not present

## 2019-07-13 DIAGNOSIS — E1165 Type 2 diabetes mellitus with hyperglycemia: Secondary | ICD-10-CM | POA: Diagnosis not present

## 2019-07-24 DIAGNOSIS — G5622 Lesion of ulnar nerve, left upper limb: Secondary | ICD-10-CM | POA: Diagnosis not present

## 2019-07-31 DIAGNOSIS — M792 Neuralgia and neuritis, unspecified: Secondary | ICD-10-CM | POA: Diagnosis not present

## 2019-07-31 DIAGNOSIS — M25562 Pain in left knee: Secondary | ICD-10-CM | POA: Diagnosis not present

## 2019-07-31 DIAGNOSIS — E1165 Type 2 diabetes mellitus with hyperglycemia: Secondary | ICD-10-CM | POA: Diagnosis not present

## 2019-07-31 DIAGNOSIS — Z79899 Other long term (current) drug therapy: Secondary | ICD-10-CM | POA: Diagnosis not present

## 2019-07-31 DIAGNOSIS — M25561 Pain in right knee: Secondary | ICD-10-CM | POA: Diagnosis not present

## 2019-08-03 DIAGNOSIS — D128 Benign neoplasm of rectum: Secondary | ICD-10-CM | POA: Diagnosis not present

## 2019-08-03 DIAGNOSIS — E1165 Type 2 diabetes mellitus with hyperglycemia: Secondary | ICD-10-CM | POA: Diagnosis not present

## 2019-08-03 DIAGNOSIS — K648 Other hemorrhoids: Secondary | ICD-10-CM | POA: Diagnosis not present

## 2019-08-14 DIAGNOSIS — R2242 Localized swelling, mass and lump, left lower limb: Secondary | ICD-10-CM | POA: Diagnosis not present

## 2019-08-29 DIAGNOSIS — Z1159 Encounter for screening for other viral diseases: Secondary | ICD-10-CM | POA: Diagnosis not present

## 2019-08-29 DIAGNOSIS — Z79891 Long term (current) use of opiate analgesic: Secondary | ICD-10-CM | POA: Diagnosis not present

## 2019-08-29 DIAGNOSIS — E1165 Type 2 diabetes mellitus with hyperglycemia: Secondary | ICD-10-CM | POA: Diagnosis not present

## 2019-08-29 DIAGNOSIS — G8929 Other chronic pain: Secondary | ICD-10-CM | POA: Diagnosis not present

## 2019-08-29 DIAGNOSIS — Z79899 Other long term (current) drug therapy: Secondary | ICD-10-CM | POA: Diagnosis not present

## 2019-08-29 DIAGNOSIS — M25561 Pain in right knee: Secondary | ICD-10-CM | POA: Diagnosis not present

## 2019-08-29 DIAGNOSIS — M25562 Pain in left knee: Secondary | ICD-10-CM | POA: Diagnosis not present

## 2019-10-30 IMAGING — CR DG KNEE COMPLETE 4+V*L*
4 series · 4 of 4 positions shown · non-contrast
Comparison: None.

ADDENDUM:
Please note enthesophyte formation is along the extensor
mechanism-particularly the patella and tibial tuberosity.
CLINICAL DATA: Left knee pain and swelling for 2 days, atraumatic

EXAM:
LEFT KNEE - COMPLETE 4+ VIEW

[t knee ap left]
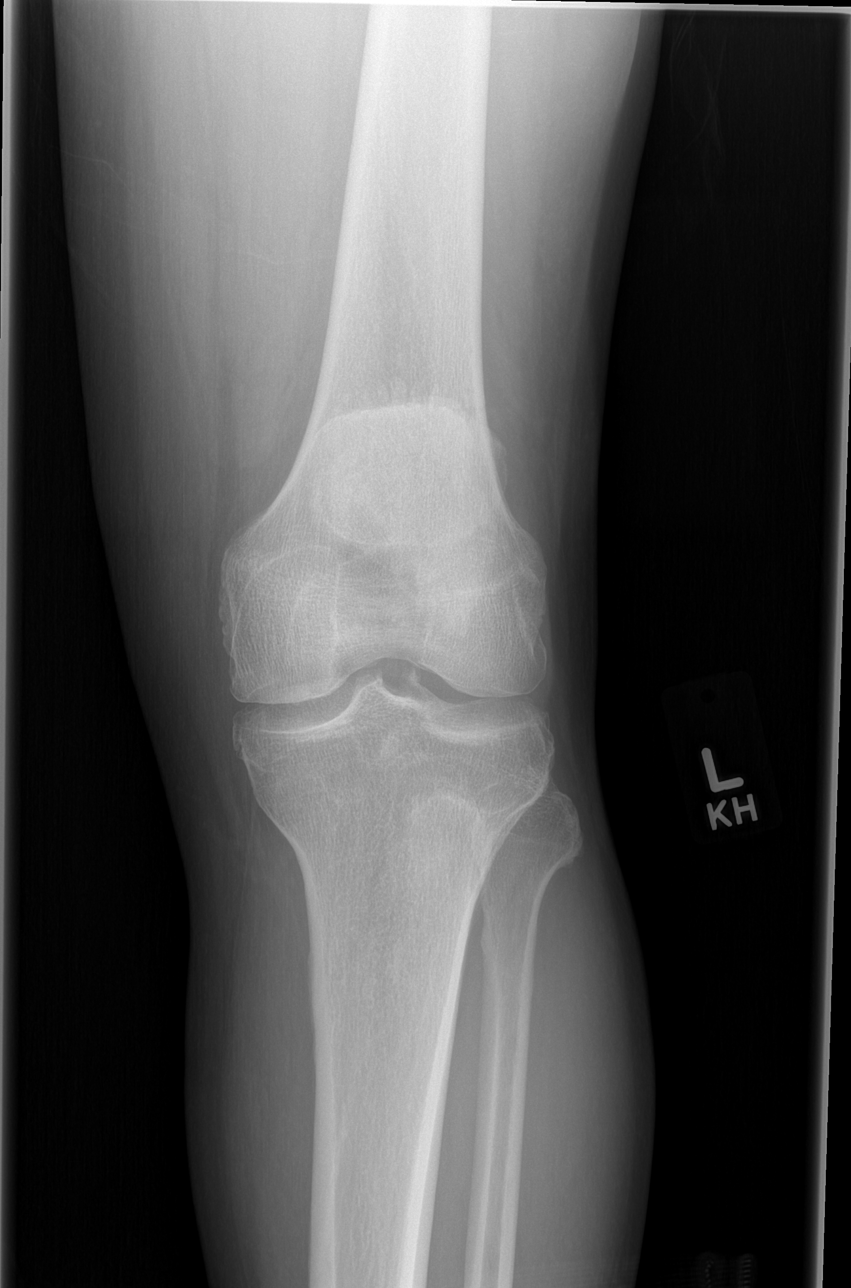

[t knee oblique left (1 of 2)]
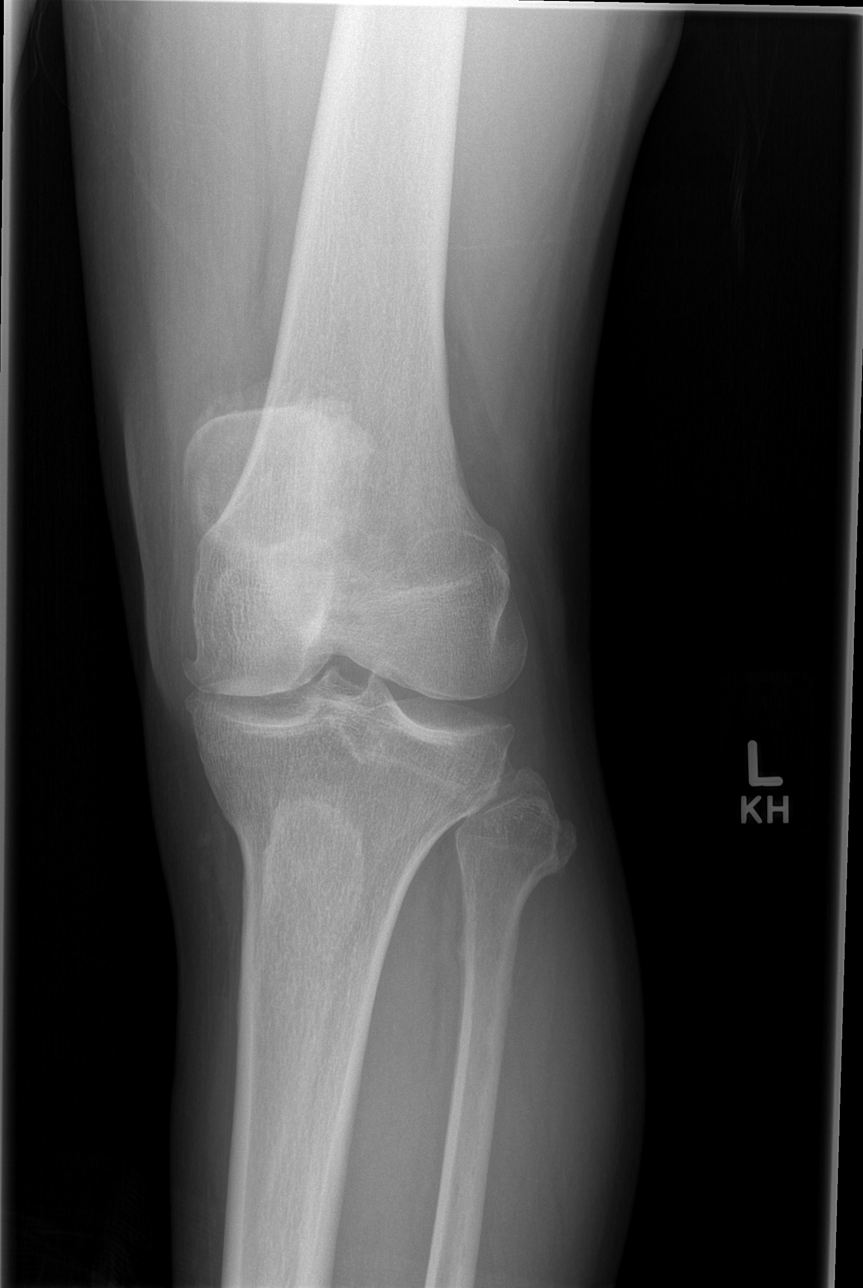

[t knee oblique left (2 of 2)]
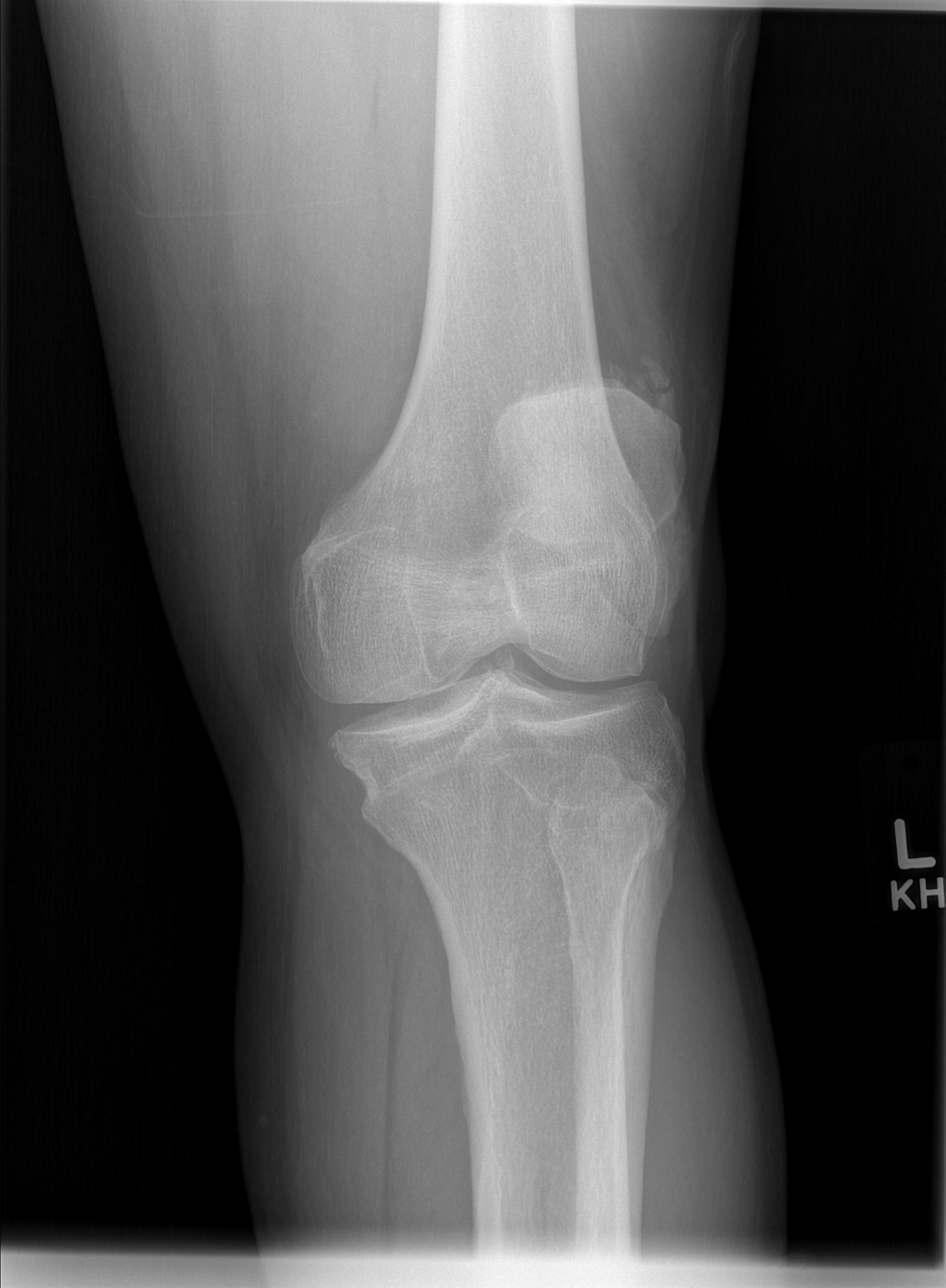

[t knee lat left]
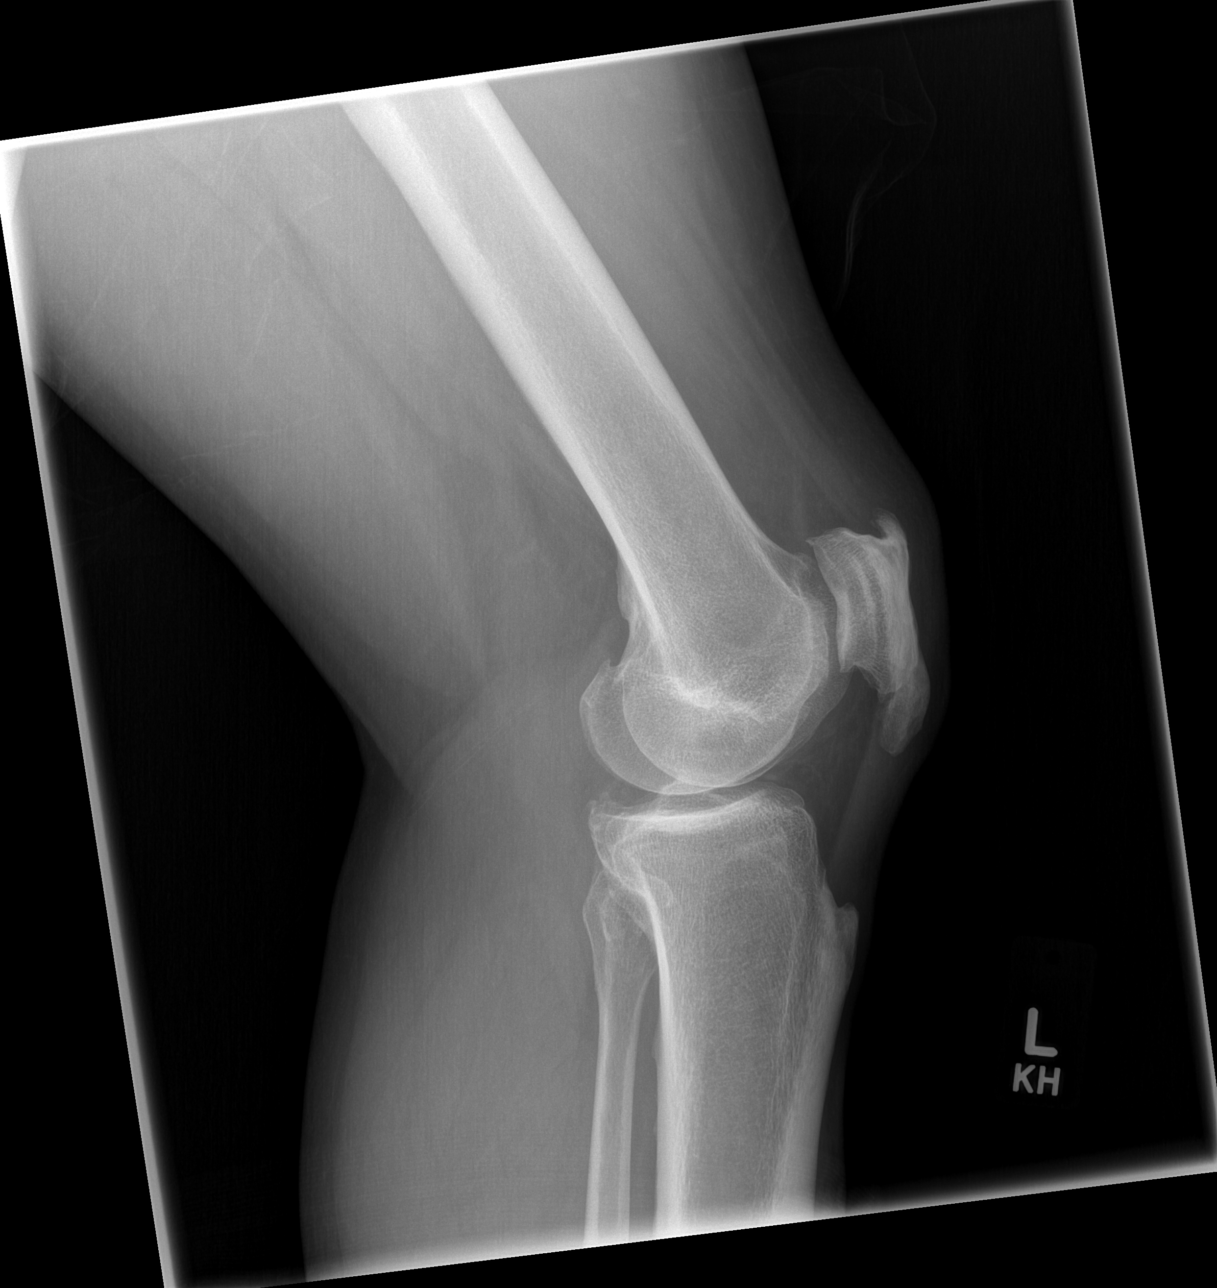

[4 of 4 positions shown; findings below may reference images not displayed]

FINDINGS: No evidence of fracture, dislocation, or joint effusion. Mild
degenerative marginal spurring. Prominent enthesophyte formation
along the flexor mechanism.
IMPRESSION: 1. No acute finding.
2. Mild degenerative marginal spurring.

## 2023-02-15 ENCOUNTER — Other Ambulatory Visit: Payer: Self-pay | Admitting: Orthopedic Surgery

## 2023-03-31 ENCOUNTER — Other Ambulatory Visit: Payer: Self-pay | Admitting: Orthopedic Surgery

## 2023-06-06 ENCOUNTER — Other Ambulatory Visit: Payer: Self-pay | Admitting: Orthopedic Surgery

## 2023-08-11 ENCOUNTER — Encounter (HOSPITAL_BASED_OUTPATIENT_CLINIC_OR_DEPARTMENT_OTHER): Admission: RE | Payer: Self-pay | Source: Home / Self Care

## 2023-08-11 ENCOUNTER — Ambulatory Visit (HOSPITAL_BASED_OUTPATIENT_CLINIC_OR_DEPARTMENT_OTHER): Admission: RE | Admit: 2023-08-11 | Payer: 59 | Source: Home / Self Care | Admitting: Orthopedic Surgery

## 2023-08-11 SURGERY — ULNAR NERVE DECOMPRESSION/TRANSPOSITION
Anesthesia: Choice | Laterality: Left
